# Patient Record
Sex: Female | Born: 1935 | Race: Black or African American | Hispanic: No | Smoking: Never smoker
Health system: Southern US, Community
[De-identification: ages and names within clinical notes are randomized; demographics above are authoritative.]

## PROBLEM LIST (undated history)

## (undated) DIAGNOSIS — I10 Essential (primary) hypertension: Secondary | ICD-10-CM

## (undated) HISTORY — DX: Essential (primary) hypertension: I10

## (undated) HISTORY — PX: CHOLECYSTECTOMY: SHX55

---

## 2013-08-15 ENCOUNTER — Telehealth: Payer: Self-pay

## 2013-08-15 ENCOUNTER — Ambulatory Visit: Payer: Medicare Other

## 2013-08-15 ENCOUNTER — Ambulatory Visit (INDEPENDENT_AMBULATORY_CARE_PROVIDER_SITE_OTHER): Payer: Medicare Other | Admitting: Emergency Medicine

## 2013-08-15 ENCOUNTER — Ambulatory Visit
Admission: RE | Admit: 2013-08-15 | Discharge: 2013-08-15 | Disposition: A | Discharge status: Home / Self Care | Payer: Medicare Other | Source: Ambulatory Visit | Attending: Emergency Medicine | Admitting: Emergency Medicine

## 2013-08-15 VITALS — BP 142/86 | HR 77 | Temp 97.9°F | Resp 16 | Ht <= 58 in | Wt 178.0 lb

## 2013-08-15 DIAGNOSIS — M25571 Pain in right ankle and joints of right foot: Secondary | ICD-10-CM

## 2013-08-15 DIAGNOSIS — M25579 Pain in unspecified ankle and joints of unspecified foot: Secondary | ICD-10-CM

## 2013-08-15 NOTE — Progress Notes (Signed)
   Subjective:    Patient ID: Kristi Williams, female    DOB: August 21, 1933, 80 y.o.   MRN: [id number]  HPI    Review of Systems     Objective:   Physical Exam        Assessment & Plan:

## 2013-08-15 NOTE — Telephone Encounter (Signed)
Pt calling to see if MRI results are in yet, she had it done this morning. Best# [phone]

## 2013-08-15 NOTE — Progress Notes (Signed)
   Subjective:    Patient ID: Kristi Williams, female    DOB: [birth year], 80 y.o.   MRN: [id number]  HPI Patient comes into our office with right ankle pain She was walking out of the house and stepped out wrong and stepped on her right heel a little harder than normal and felt a pain immediately in her heel   hurts to put pressure on it No calf pain or swelling been using ice on the heel and keeping it elevated No redness but patient states that it feels better when elevated than just hanging down Drives [place] and fourth from [place] to [place] to keep grand kids often    Review of Systems     Objective:   Physical Exam patient has no calf tenderness. She has exquisite tenderness over the Achilles insertion on the heel. There is minimal ankle swelling. She actually has fairly good plantar flexion strength and dorsiflexion strength.  [facility] reading (PRIMARY) by  Dr. Cleta Albertsaub is a spur present over the Achilles attachment and spur present at the plantar fascia attachment. There is an old avulsed distal to the medial malleolus.        Assessment & Plan:  Patient has a long trip planned to [place]. We'll see if we can get an MRI of the ankle rule out an Achilles rupture. MRI showed no evidence of Achilles rupture. We'll call the patient see if she was to come by and have us put on a Swede-O support

## 2013-08-15 NOTE — Telephone Encounter (Signed)
Per Dr. Cleta Albertsaub no achilles tear. Advise stop by office to pick up a sweedo ankle brace. Patient notified and voiced understanding. She will stop by tomorrow 08/16/13 to be fitted.

## 2013-08-16 ENCOUNTER — Ambulatory Visit (INDEPENDENT_AMBULATORY_CARE_PROVIDER_SITE_OTHER): Payer: Medicare Other | Admitting: Family Medicine

## 2013-08-16 VITALS — BP 150/100 | HR 60 | Temp 97.9°F | Resp 16 | Ht 64.5 in | Wt 182.0 lb

## 2013-08-16 DIAGNOSIS — M7660 Achilles tendinitis, unspecified leg: Secondary | ICD-10-CM

## 2013-08-16 DIAGNOSIS — M7661 Achilles tendinitis, right leg: Secondary | ICD-10-CM

## 2013-08-16 NOTE — Patient Instructions (Signed)
You have achilles tendonitis. Keep icing the ankle 2-3 times a day. You can use aspirin as needed for pain. Wear heel cups in your shoes to take pressure off the tendon.  Follow up in 1 week if not improving.

## 2013-08-16 NOTE — Progress Notes (Signed)
   Subjective:    Patient ID: Kristi Williams, female    DOB: 1934-01-08, 80 y.o.   MRN: [id number]  HPI Kristi Williams is here for f/u of right ankle pain.  She was seen yesterday for acute right posterior ankle pain.  An MRI was done and was negative for achilles rupture.  It did show some swelling around the achilles.  She does have bone spurs as well.  Today, she states that the pain is much better.  Her son bought her some orthotics with a little bit of heel elevation and this has helped enormously.  She was able to walk her dog this morning with minimal discomfort.  States the swelling is much better.  She is taking aspirin as needed for pain, last dose this morning.  No calf pain.  No numbness or tingling in her foot.  Current Outpatient Prescriptions on File Prior to Visit  Medication Sig Dispense Refill  . valsartan-hydrochlorothiazide (DIOVAN-HCT) 160-25 MG per tablet Take 1 tablet by mouth daily.       No current facility-administered medications on file prior to visit.    PMHx: HTN  SHx: non smoker   Review of Systems See HPI    Objective:   Physical Exam BP 150/100  Pulse 60  Temp(Src) 97.9 F (36.6 C) (Oral)  Resp 16  Ht 5' 4.5" (1.638 m)  Wt 182 lb (82.555 kg)  BMI 30.77 kg/m2  SpO2 98% Gen: alert, cooperative, NAD Right ankle: minimal swelling; full range of motion; tender at achilles insertion only; anterior drawer normal; 5/5 strength in dorsi and plantar flexion without pain  MRI reviewed.    Assessment & Plan:  80 yo W with right achilles tendonitis.  # Right achilles tendonitis: Consistent with history and exam as well as MRI.  No signs of achilles rupture or DVT. - continue with elevation and ice - continue with aspirin prn for pain - no brace needed - recommended heel cups to take pressure off the tendon - okay to drive to [place] - f/u is not improved in 1 week

## 2013-08-18 ENCOUNTER — Encounter: Payer: Self-pay | Admitting: Family Medicine

## 2013-08-18 NOTE — Progress Notes (Addendum)
History and physical examinations obtained with Dr. Piedad ClimesHonig; no R calf TTP; full ROM of ankle R; Hommen's negative; DP 2+ of R foot; capillary refill < 3 seconds;  +TTP Achille's insertion on R only.  Minimal swelling of R ankle.  Foot R non-tender.   MRI reviewed.  Agree with A/P.

## 2014-10-07 IMAGING — CR DG ANKLE COMPLETE 3+V*R*
4 series · 4 of 4 positions shown · non-contrast
Comparison: None.

CLINICAL DATA: Right ankle pain.

EXAM:
RIGHT ANKLE - COMPLETE 3+ VIEW

[AP]
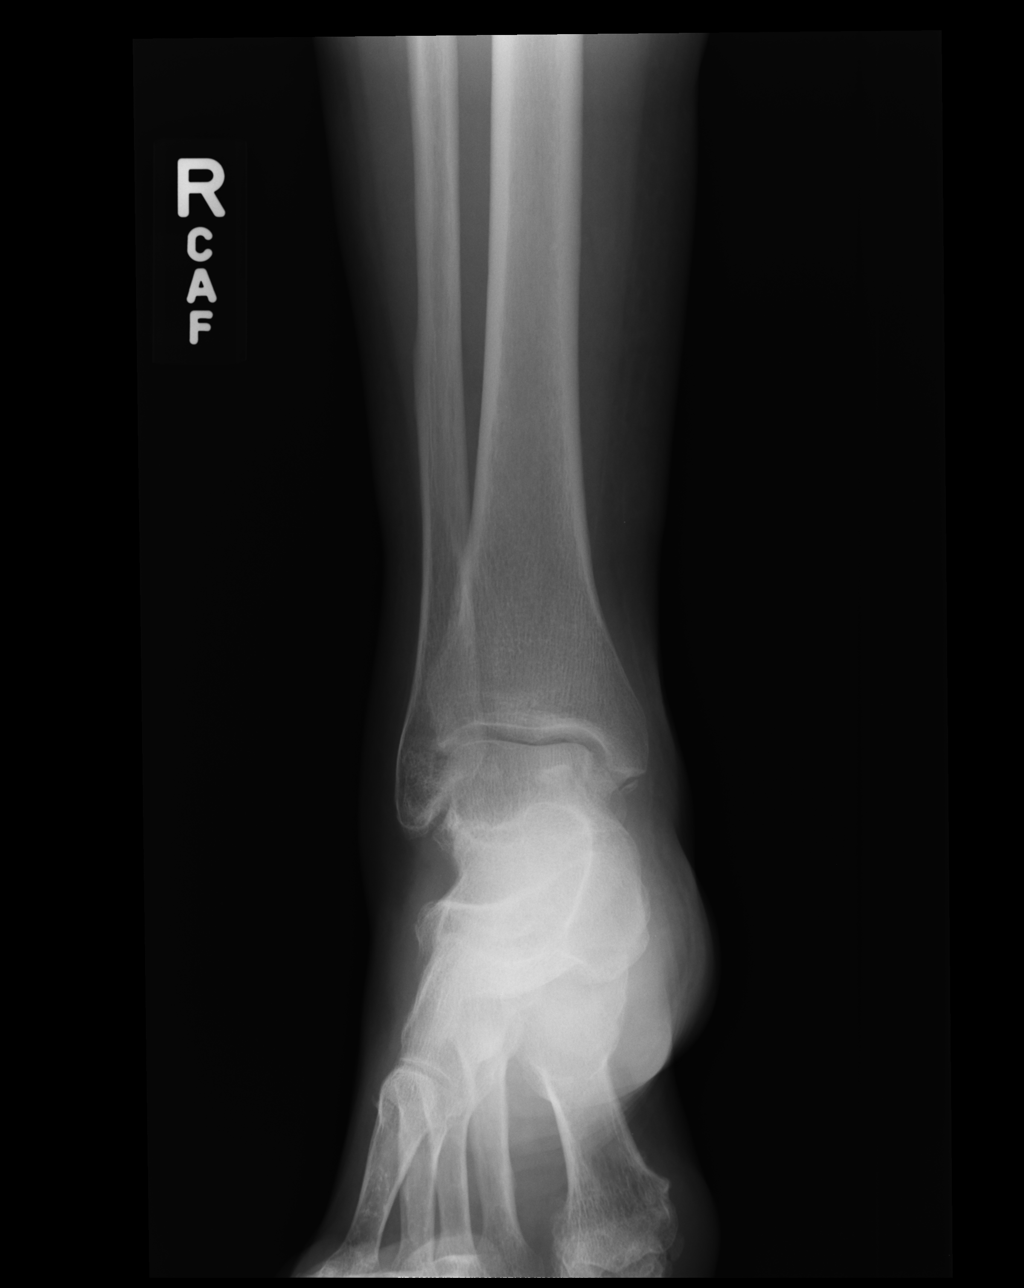

[ap obl int rot]
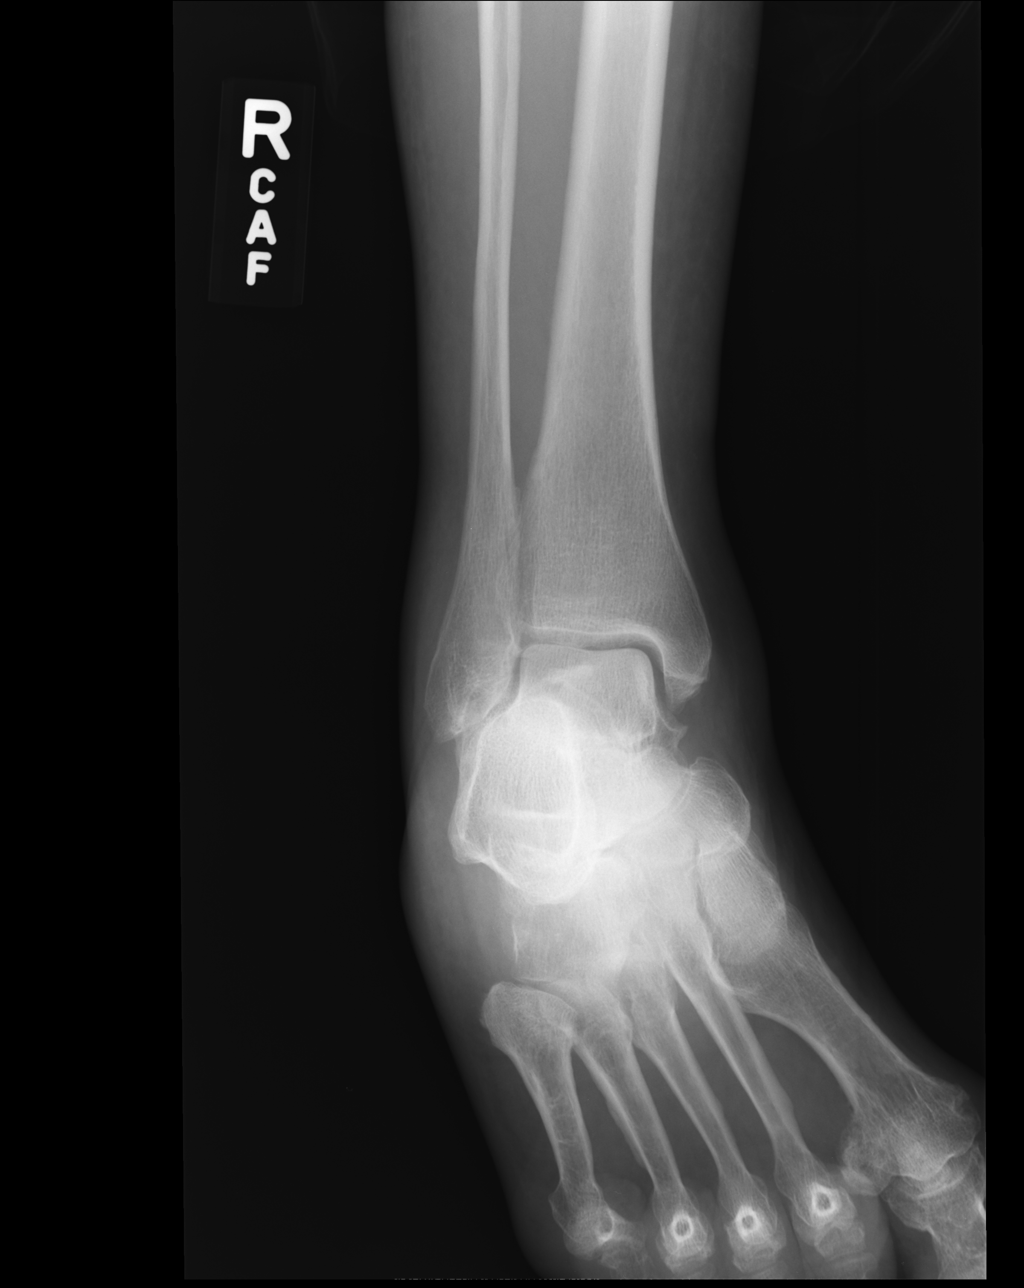

[ap obl ext rot]
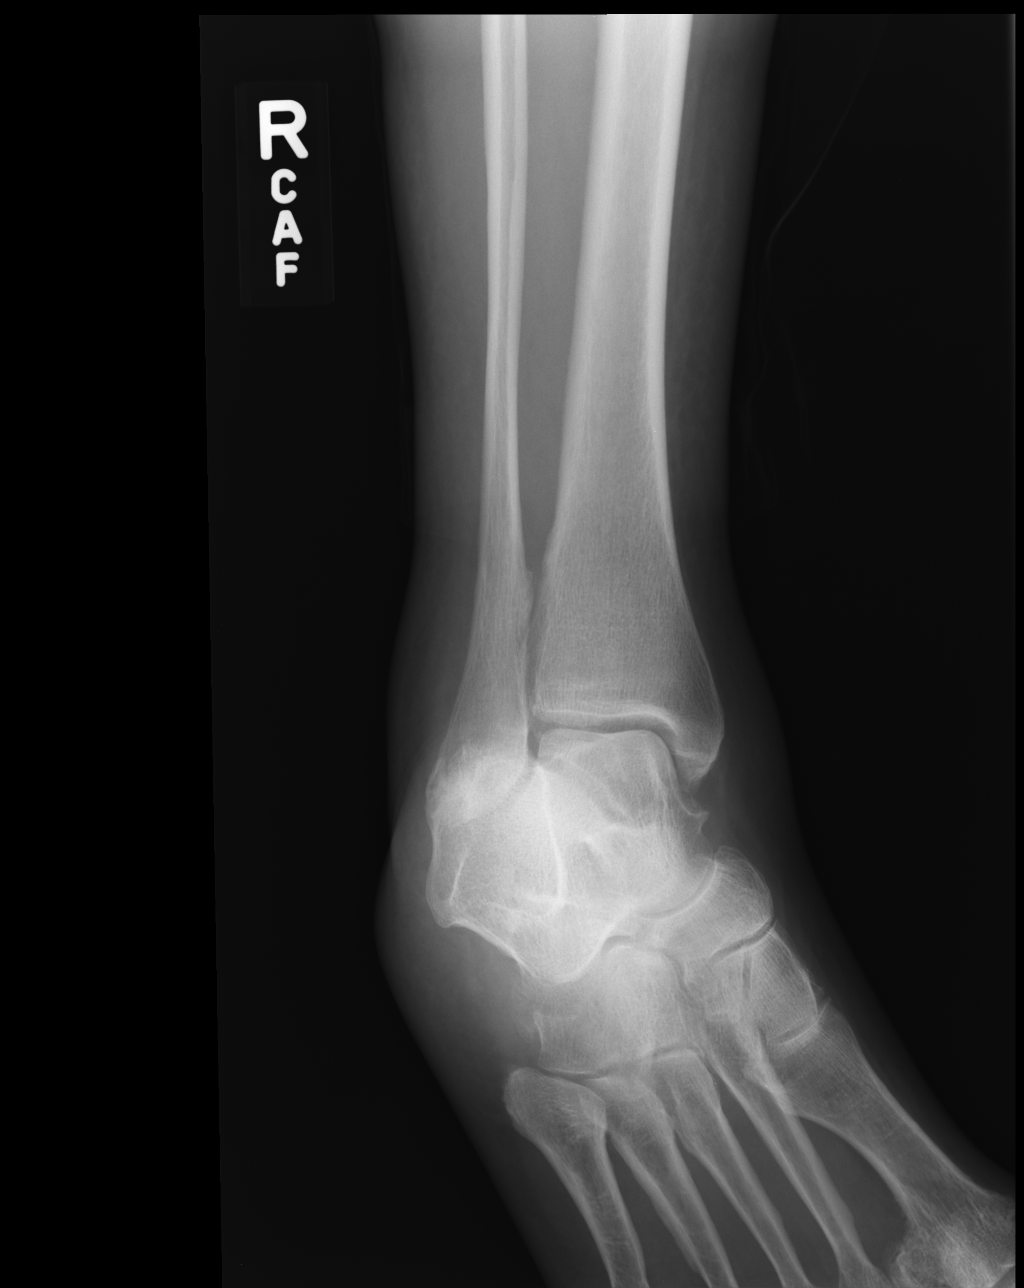

[lateral]
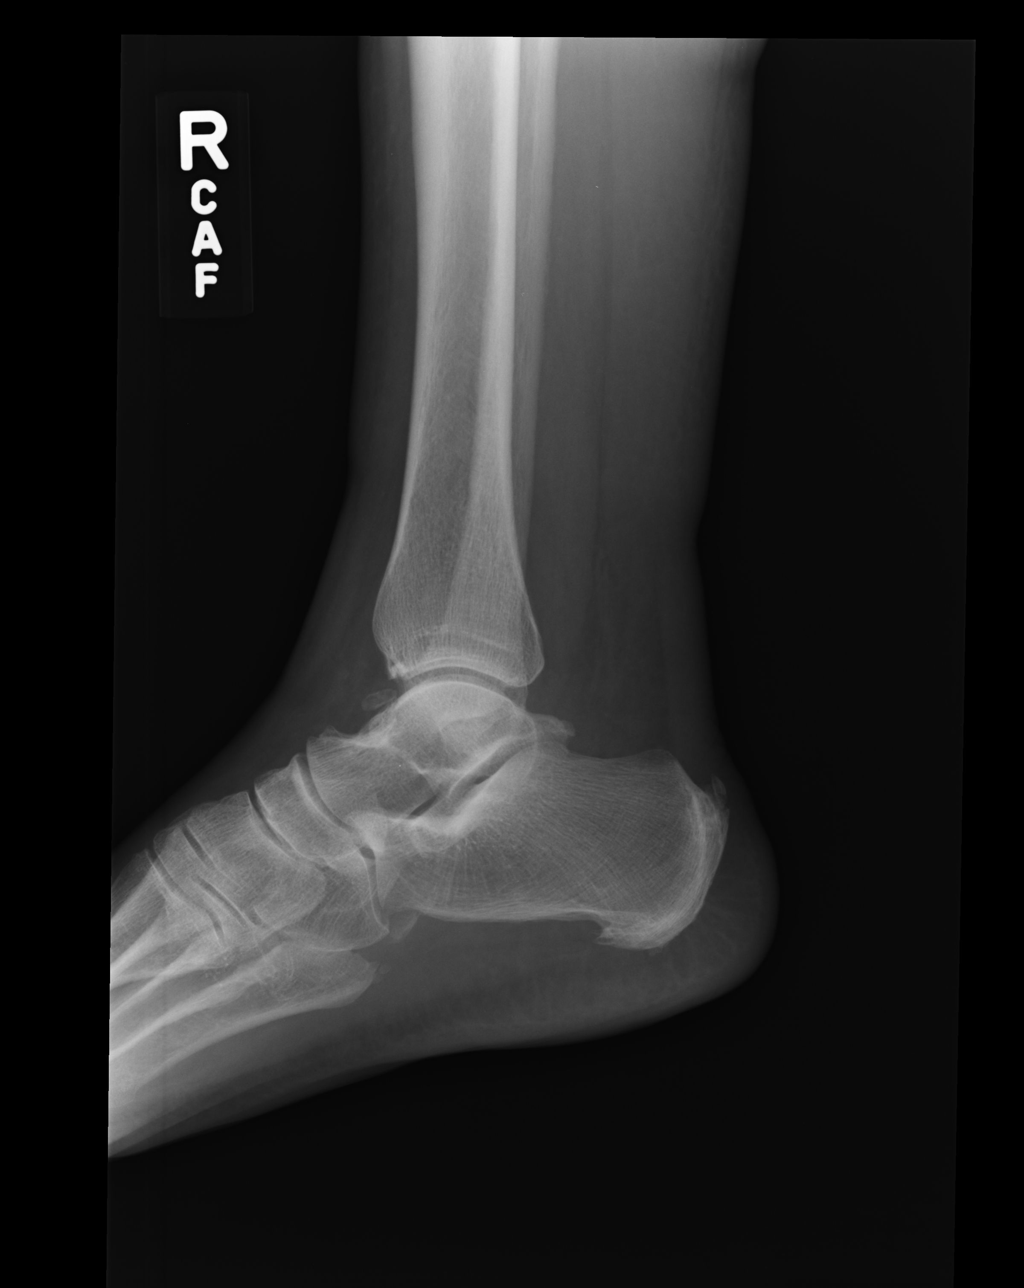

[4 of 4 positions shown; findings below may reference images not displayed]

FINDINGS: No acute fracture or dislocation is noted. The talotibial joint
appears intact. Probable old avulsion fracture is seen involving
medial malleolus. Spurring of posterior calcaneus is noted. No
significant soft tissue abnormality is noted.
IMPRESSION: No acute abnormality seen in the right ankle.
